# Patient Record
Sex: Female | Born: 1995 | Race: White | Hispanic: No | Marital: Single | Smoking: Never smoker
Health system: Southern US, Community
[De-identification: ages and names within clinical notes are randomized; demographics above are authoritative.]

## PROBLEM LIST (undated history)

## (undated) DIAGNOSIS — F419 Anxiety disorder, unspecified: Secondary | ICD-10-CM

## (undated) HISTORY — PX: COSMETIC SURGERY: SHX468

## (undated) HISTORY — DX: Anxiety disorder, unspecified: F41.9

---

## 2015-02-18 ENCOUNTER — Ambulatory Visit: Payer: Self-pay | Admitting: Family Medicine

## 2015-03-11 ENCOUNTER — Ambulatory Visit: Payer: Self-pay | Admitting: Family Medicine

## 2015-08-11 ENCOUNTER — Ambulatory Visit (INDEPENDENT_AMBULATORY_CARE_PROVIDER_SITE_OTHER): Payer: Worker's Compensation | Admitting: Family Medicine

## 2015-08-11 ENCOUNTER — Encounter: Payer: Self-pay | Admitting: Family Medicine

## 2015-08-11 VITALS — BP 124/77 | HR 87 | Ht 66.0 in | Wt 150.0 lb

## 2015-08-11 DIAGNOSIS — S060X2A Concussion with loss of consciousness of 31 minutes to 59 minutes, initial encounter: Secondary | ICD-10-CM

## 2015-08-11 MED ORDER — AMITRIPTYLINE HCL 25 MG PO TABS: 25.0000 mg | ORAL_TABLET | Freq: Every day | ORAL | Status: DC

## 2015-08-11 NOTE — Patient Instructions (Addendum)
You have postconcussion syndrome. When you get copies of your records please let us copy them as well (especially the CT scan). Take amitriptyline  at bedtime - we have room to go up on this if needed. Start CBT as well. Let me know if you're having problems with classes so we can make other arrangements. Follow up with me in 4 weeks.

## 2015-08-16 ENCOUNTER — Telehealth: Payer: Self-pay | Admitting: Family Medicine

## 2015-08-16 NOTE — Telephone Encounter (Signed)
Spoke to patient and told her the information provided by the physician. She stated that she would take 2 at bedtime daily and would call us back in several days to let us know how she is doing.

## 2015-08-16 NOTE — Telephone Encounter (Signed)
If after a week she's tolerating it but it's not seeming to help enough we can double it (she can take 2 at bedtime daily and we could send in a script for the higher dose too).  Just let me know if she wants to do this.

## 2015-08-17 DIAGNOSIS — F0781 Postconcussional syndrome: Secondary | ICD-10-CM | POA: Insufficient documentation

## 2015-08-17 NOTE — Assessment & Plan Note (Signed)
having major issues with headaches, difficulty with concentration and memory primarily.  Discussed options and will go ahead with rx amitriptyline and CBT as next steps.  Discussed light cardio at most which may help with recovery if symptoms do not worsen with this.  F/u in 4 weeks.  Given note for school - needs additional time to complete coursework, may need frequent breaks.
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
## 2015-08-19 NOTE — Telephone Encounter (Signed)
Case worker took prescription for CBT and will arrange.

## 2015-08-25 ENCOUNTER — Ambulatory Visit: Payer: Worker's Compensation | Admitting: Family Medicine

## 2015-08-31 ENCOUNTER — Ambulatory Visit: Payer: Worker's Compensation | Admitting: Family Medicine

## 2015-09-01 ENCOUNTER — Ambulatory Visit: Payer: Worker's Compensation | Admitting: Family Medicine

## 2015-09-06 ENCOUNTER — Ambulatory Visit: Payer: Self-pay | Admitting: Family Medicine

## 2015-09-07 ENCOUNTER — Encounter: Payer: Self-pay | Admitting: Family Medicine

## 2015-09-07 ENCOUNTER — Ambulatory Visit: Payer: Worker's Compensation | Attending: Psychology | Admitting: Psychology

## 2015-09-07 ENCOUNTER — Ambulatory Visit (INDEPENDENT_AMBULATORY_CARE_PROVIDER_SITE_OTHER): Payer: Worker's Compensation | Admitting: Family Medicine

## 2015-09-07 ENCOUNTER — Ambulatory Visit: Payer: Self-pay | Admitting: Family Medicine

## 2015-09-07 VITALS — BP 127/80 | HR 80 | Ht 66.0 in | Wt 150.0 lb

## 2015-09-07 DIAGNOSIS — F0781 Postconcussional syndrome: Secondary | ICD-10-CM
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
## 2015-09-07 NOTE — Patient Instructions (Signed)
You have postconcussion syndrome. Keep your appointment today with the neuropsychologist. Follow up with me in 1 month for reevaluation. No sports.  Ok for light weight lifting as we discussed, cardio.  Avoid anything that causes symptoms to occur.

## 2015-09-09 NOTE — Assessment & Plan Note (Signed)
first visit with neuropsychologist is today - encouraged to follow through with this even though she's clinically improving.  Will hold off on any medications at this point - no benefit with amitriptyline and may have caused her to have severe nausea.  F/u in 1 month.  No sports in meantime (cardio, running ok).
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
## 2015-09-13 ENCOUNTER — Telehealth: Payer: Self-pay | Admitting: Family Medicine

## 2015-09-13 NOTE — Telephone Encounter (Signed)
This is part of her postconcussion syndrome for which we are treating her along with neuropsych.  Referral to neurology is not indicated at this time.

## 2015-09-16 ENCOUNTER — Telehealth: Payer: Self-pay | Admitting: *Deleted

## 2015-09-16 ENCOUNTER — Ambulatory Visit: Payer: Worker's Compensation | Attending: Psychology | Admitting: Psychology

## 2015-09-16 DIAGNOSIS — F0781 Postconcussional syndrome: Secondary | ICD-10-CM | POA: Insufficient documentation

## 2015-09-16 MED ORDER — PROPRANOLOL HCL 60 MG PO TABS: 30.0000 mg | ORAL_TABLET | Freq: Three times a day (TID) | ORAL | Status: DC

## 2015-09-16 NOTE — Telephone Encounter (Signed)
Patient called and wants something for headaches.

## 2015-09-16 NOTE — Telephone Encounter (Signed)
Spoke to patient and told her prescription sent to her pharmacy.

## 2015-09-16 NOTE — Progress Notes (Signed)
[facility]  [address]   Telephone [phone] [address] Fax [phone] [place], [place] [address]   Psychology Progress Note   Name:  Teresa Dean Date of Birth:  June 30, 1996 MRN:  [id number]  Date:09/16/2015 (63m) session #1 Received message that her worker's compensation carrier has approved treatment as requested.  Reviewed her symptoms, actvity level and coping. She reported moderate to severe headaches that have occured mainly in the late afternoon.The only trigger she could identify was mental exertion. She described her thinking as often "foggy", mostly while having a headache but sometimes when not having a headache. She reported being easily distracted, both internally and externally. Sleeping described as disrupted by two or three brief mid-night awakenings but otherwise decent. Reports she is eating well. Denied use of alcohol or drugs. No report of difficulties with mood or mood control though she did report experiencing intruisive thoughts about past traumatic experiences. No report of suicidal ideation or psychotic symptoms. She expressed reluctance to quit her part-time jobs as was previously suggested..  Diagnostic Impression Postconcussional syndrome [F 07.81]  Recommended that she: Strongly consider use of headache medication that was suggested by her physician.  Try to schedule a rest period or short nap every day after class around lunchtime before her headache starts. For headache reduction, try putting a cold washcloth on her forehead and wearing sunglasses inside and outside to reduce light stimulation. Continue to look for possible triggers for headache.  Talk to her psychotherapist about ways to control any intrusive thoughts or feelings related to her past trauma.  Return in two weeks.   Gladstone Pih, Ph.D Licensed Psychologist

## 2015-09-16 NOTE — Telephone Encounter (Signed)
Sent propranolol to her pharmacy - start with 1/2 tablet three times a day.  If she tolerates this in the future we can increase to a full tablet three times a day.  Thanks!

## 2015-09-30 ENCOUNTER — Ambulatory Visit: Payer: Worker's Compensation | Attending: Psychology | Admitting: Psychology

## 2015-09-30 DIAGNOSIS — F0781 Postconcussional syndrome: Secondary | ICD-10-CM | POA: Insufficient documentation
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
## 2015-10-05 ENCOUNTER — Telehealth: Payer: Self-pay | Admitting: Family Medicine

## 2015-10-05 MED ORDER — METHYLPHENIDATE HCL 10 MG PO TABS: 10.0000 mg | ORAL_TABLET | Freq: Two times a day (BID) | ORAL | Status: DC

## 2015-10-05 NOTE — Telephone Encounter (Signed)
Spoke to patient and told her that she would have to come to pick up the prescription. It would be at the front desk.

## 2015-10-05 NOTE — Telephone Encounter (Signed)
We can try this though it is controversial.  She would have to pick up the prescription though for ritalin.  I will print for her - please notify patient it's up front.  Thanks!

## 2015-10-07 ENCOUNTER — Ambulatory Visit: Payer: Self-pay | Admitting: Family Medicine

## 2015-10-08 ENCOUNTER — Telehealth: Payer: Self-pay | Admitting: *Deleted

## 2015-10-08 NOTE — Telephone Encounter (Signed)
That's extremely unlikely to be due to adderall based on the product insert of the medication.

## 2015-10-08 NOTE — Telephone Encounter (Signed)
Patient called and wanted to know if it is normal for the medicine you prescribed her would make her sleepy?

## 2015-10-08 NOTE — Telephone Encounter (Signed)
Spoke to patient and gave her the information provided by the physician. Told patient to call Monday to let us know how she is doing.

## 2015-10-11 ENCOUNTER — Telehealth: Payer: Self-pay | Admitting: *Deleted

## 2015-10-11 NOTE — Telephone Encounter (Signed)
Patient called about her medication. Reminded patient that she has a follow up on 10-14-15 and she could discuss her medication with the doctor.

## 2015-10-14 ENCOUNTER — Ambulatory Visit (INDEPENDENT_AMBULATORY_CARE_PROVIDER_SITE_OTHER): Payer: Worker's Compensation | Admitting: Family Medicine

## 2015-10-14 ENCOUNTER — Encounter: Payer: Self-pay | Admitting: Family Medicine

## 2015-10-14 ENCOUNTER — Ambulatory Visit: Payer: Worker's Compensation | Attending: Psychology | Admitting: Psychology

## 2015-10-14 VITALS — BP 129/81 | HR 71 | Ht 66.0 in | Wt 150.0 lb

## 2015-10-14 DIAGNOSIS — F0781 Postconcussional syndrome: Secondary | ICD-10-CM | POA: Diagnosis not present

## 2015-10-14 MED ORDER — AMPHETAMINE-DEXTROAMPHET ER 20 MG PO CP24: 20.0000 mg | ORAL_CAPSULE | Freq: Every day | ORAL | Status: DC

## 2015-10-14 NOTE — Progress Notes (Signed)
[facility]  [address]   Telephone [phone] [address] Fax [phone] [place], [place] [address]   Psychology Progress Note   Name:  Teresa Dean Date of Birth:  1996-05-08 MRN:  [id number]  Date:10/14/2015 (16m psychotherapy Met with JGregary Signsindividually and then with her case manager present.  She reported that her concentration was much better, she felt "calmer" and had fewer racing thoughts after using Ritalin 15 mg. though the positive effects dissipated after about two hours. She reported that after taking Ritalin she cleaned her apartment for two hours without taking a break though otherwise denied symptoms of mania. She reported "sour stomach" after taking Ritalin. She will discuss medication further with Dr. HBarbaraann Barthel She asked about the possible role her concussion might be playing with her difficulty "regulating my emotions", which she defined as being suddenly "flooded" by emotions, typically anxiety or irritability. Since this emotional pattern predated her concussion by about two years, I would conclude that her concussion was not causal but it is possible that it somewhat intensified this problem. She reported that last week she re-established care with a psychiatrist, who has switched her from Lexapro to Wellbutrin.  [name]'s status and the results of neuropsychological testing on 09/30/15 were reviewed with her case manager. Both Ms. FRivka Barbaraand JHelemrequested and were given copies of that report.  Diagnostic Impression Postconcussional syndrome [F07.81]  At this point, I do not have more to offer [name] We agreed to terminate neuropsychological services at this time. She is welcome to contact me in the future as needed. Neuropsychological re-testing in several months or in a year could be considered to track any changes from her baseline of 09/30/15. She will continue to see her psychotherapist and psychiatrist.    MJamey Ripa  Ph.D Licensed Psychologist

## 2015-10-14 NOTE — Patient Instructions (Signed)
Switch from ritalin to an extended release medication (Adderall XR). We can increase the propranolol to a full tablet (60mg ) three times a day - you can do this on your own if the headaches are frequent. Follow up with me in 1 month.
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
## 2015-10-19 NOTE — Assessment & Plan Note (Signed)
Mixed on whether she's improving or about the same - SCAT 3 scores slightly worse though headaches are less.  Released from neuropsych today.  Doing well with propranolol 30mg  3x/day - advised and increase to the full 60mg  three times a day dose if headaches do not continue to improve.  We will switch to an extended medication for her concentration - adderall XR.  F/u in 1 month for reevaluation.

## 2015-11-03 ENCOUNTER — Telehealth: Payer: Self-pay | Admitting: Family Medicine

## 2015-11-03 NOTE — Telephone Encounter (Signed)
Spoke to patient and gave her information that was provided by physician.

## 2015-11-03 NOTE — Telephone Encounter (Signed)
Adderall XR does run out by the evening - it has a length of action of 8-10 hours.  Make sure she's taking it first thing in the morning only.  If she's still having trouble sleeping it may be from general insomnia or some other factor.  I looked up the other medications she's taking to see if any of them interact with the adderall potentially making it last longer and none of them do this.

## 2015-11-16 ENCOUNTER — Encounter: Payer: Self-pay | Admitting: Family Medicine

## 2015-11-16 ENCOUNTER — Ambulatory Visit (INDEPENDENT_AMBULATORY_CARE_PROVIDER_SITE_OTHER): Payer: Worker's Compensation | Admitting: Family Medicine

## 2015-11-16 VITALS — BP 109/74 | HR 89 | Ht 66.0 in | Wt 145.0 lb

## 2015-11-16 DIAGNOSIS — F0781 Postconcussional syndrome: Secondary | ICD-10-CM | POA: Diagnosis not present

## 2015-11-16 MED ORDER — AMPHETAMINE-DEXTROAMPHET ER 30 MG PO CP24: 30.0000 mg | ORAL_CAPSULE | Freq: Every day | ORAL | Status: DC

## 2015-11-16 NOTE — Patient Instructions (Signed)
Stop the propranolol after the semester. Increase adderall xr to 30mg  daily. Follow up with me in 6 weeks for reevaluation.
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
## 2015-11-17 NOTE — Assessment & Plan Note (Signed)
Headaches are improved - will try to wean off propanolol after this semester finishes.  Concentration still an issue - will increase adderall xr from 20mg  to 30mg .  Follow up with us in 6 weeks.  Hopefully at that time we can start to decrease the adderall as other symptoms continue to improve.    Total visit time 20 minutes of which half was spent on counseling, answering questions.

## 2015-12-29 ENCOUNTER — Encounter: Payer: Self-pay | Admitting: Family Medicine

## 2015-12-29 ENCOUNTER — Ambulatory Visit (INDEPENDENT_AMBULATORY_CARE_PROVIDER_SITE_OTHER): Payer: Worker's Compensation | Admitting: Family Medicine

## 2015-12-29 VITALS — BP 119/72 | HR 90 | Ht 66.0 in | Wt 145.0 lb

## 2015-12-29 DIAGNOSIS — F0781 Postconcussional syndrome: Secondary | ICD-10-CM | POA: Diagnosis not present

## 2015-12-29 MED ORDER — AMPHETAMINE-DEXTROAMPHET ER 30 MG PO CP24: 30.0000 mg | ORAL_CAPSULE | Freq: Every day | ORAL | Status: DC

## 2015-12-29 NOTE — Patient Instructions (Signed)
You are doing very well. Use adderall still but see if you can try off of this some days and see how you do. Follow up with me in 2 months.

## 2015-12-30 NOTE — Assessment & Plan Note (Signed)
Clinically improving though big test will be when she returns to school.  Continue with adderall xr 30mg  as needed - advised to try weaning off this as well if possible.  F/u in 2 months.  Total visit time 20 minutes of which half was spent on counseling, answering questions.
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
## 2016-01-19 ENCOUNTER — Emergency Department (HOSPITAL_COMMUNITY)
Admission: EM | Admit: 2016-01-19 | Discharge: 2016-01-20 | Disposition: A | Discharge status: Home / Self Care | Payer: Worker's Compensation | Attending: Emergency Medicine | Admitting: Emergency Medicine

## 2016-01-19 ENCOUNTER — Encounter (HOSPITAL_COMMUNITY): Payer: Self-pay

## 2016-01-19 DIAGNOSIS — R300 Dysuria: Secondary | ICD-10-CM | POA: Diagnosis present

## 2016-01-19 DIAGNOSIS — Z79899 Other long term (current) drug therapy: Secondary | ICD-10-CM | POA: Diagnosis not present

## 2016-01-19 DIAGNOSIS — R338 Other retention of urine: Secondary | ICD-10-CM

## 2016-01-19 DIAGNOSIS — R339 Retention of urine, unspecified: Secondary | ICD-10-CM | POA: Diagnosis not present

## 2016-01-19 DIAGNOSIS — R34 Anuria and oliguria: Secondary | ICD-10-CM | POA: Diagnosis not present

## 2016-01-19 DIAGNOSIS — Z880 Allergy status to penicillin: Secondary | ICD-10-CM | POA: Diagnosis not present

## 2016-01-19 DIAGNOSIS — Z3202 Encounter for pregnancy test, result negative: Secondary | ICD-10-CM | POA: Diagnosis not present

## 2016-01-19 HISTORY — PX: MOUTH SURGERY: SHX715

## 2016-01-19 LAB — URINALYSIS, ROUTINE W REFLEX MICROSCOPIC
Bilirubin Urine: NEGATIVE
GLUCOSE, UA: NEGATIVE mg/dL
Hgb urine dipstick: NEGATIVE
KETONES UR: NEGATIVE mg/dL
LEUKOCYTES UA: NEGATIVE
NITRITE: NEGATIVE
PROTEIN: NEGATIVE mg/dL
Specific Gravity, Urine: 1.014 (ref 1.005–1.030)
pH: 7 (ref 5.0–8.0)

## 2016-01-19 LAB — PREGNANCY, URINE: PREG TEST UR: NEGATIVE

## 2016-01-19 NOTE — ED Notes (Signed)
Pt had an oral surgery earlier this morning, had been sedated and tubed for the surgery. Hasn't been able to void since leaving the hospital this morning except for a little before she came in tonight. They called the MD who did the surgery and he recommended she be seen.

## 2016-01-20 NOTE — ED Provider Notes (Signed)
CSN: [id number]     Arrival date & time 01/19/16  2231 History   First MD Initiated Contact with Patient 01/19/16 2346     Chief Complaint  Patient presents with  . Post-op Problem     (Consider location/radiation/quality/duration/timing/severity/associated sxs/prior Treatment) HPI Comments: Recent states she had plastic surgery early yesterday morning.  Since that time.  She has only voided once.  She feels like she needs to void.  She states she is pushing pushing pushing without any results, although she did void prior to arrival in the emergency department.  Denies any fever.  States she history came fluids, no nausea, vomiting, fever, constipation.  The history is provided by the patient.    History reviewed. No pertinent past medical history. Past Surgical History  Procedure Laterality Date  . Mouth surgery Right 01/19/16   No family history on file. Social History  Substance Use Topics  . Smoking status: Never Smoker   . Smokeless tobacco: None  . Alcohol Use: None   OB History    No data available     Review of Systems  Constitutional: Negative for fever and chills.  Gastrointestinal: Negative for abdominal pain and abdominal distention.  Genitourinary: Positive for decreased urine volume. Negative for dysuria.  Neurological: Negative for headaches.  All other systems reviewed and are negative.     Allergies  Amoxicillin  Home Medications   Prior to Admission medications   Medication Sig Start Date End Date Taking? Authorizing Provider  amphetamine-dextroamphetamine (ADDERALL XR) 30 MG 24 hr capsule Take 1 capsule (30 mg total) by mouth daily. 12/29/15  Yes Lenda Kelp, MD  buPROPion (WELLBUTRIN XL) 150 MG 24 hr tablet TK 1 T PO Q MORNING FOR MOOD 10/05/15  Yes Historical Provider, MD   BP 113/75 mmHg  Pulse 61  Temp(Src) 98.2 F (36.8 C) (Oral)  Resp 16  SpO2 100%  LMP 01/02/2016 Physical Exam  Constitutional: She is oriented to person, place, and  time. She appears well-developed and well-nourished.  HENT:  Head: Normocephalic.  Eyes: Pupils are equal, round, and reactive to light.  Neck: Normal range of motion.  Cardiovascular: Normal rate and regular rhythm.   Pulmonary/Chest: Effort normal and breath sounds normal.  Abdominal: Soft. She exhibits no distension. There is no tenderness.  Musculoskeletal: Normal range of motion.  Neurological: She is alert and oriented to person, place, and time.  Skin: Skin is warm.  Nursing note and vitals reviewed.   ED Course  Procedures (including critical care time) Labs Review Labs Reviewed  URINALYSIS, ROUTINE W REFLEX MICROSCOPIC (NOT AT [facility]) - Abnormal; Notable for the following:    APPearance CLOUDY (*)    All other components within normal limits  PREGNANCY, URINE    Imaging Review No results found. I have personally reviewed and evaluated these images and lab results as part of my medical decision-making.   EKG Interpretation None     urine sample was obtained.  No sign of urinary tract infection.  Bladder scan shows that she is 83 cc of urine in her bladder.  She will be given by mouth fluids and reassessed Patient was able to urinate spontaneously and feels MDM   Final diagnoses:  Dysuria  Acute urinary retention         Earley Favor, NP 01/20/16 0236  Tomasita Crumble, MD 01/20/16 [phone]

## 2016-01-20 NOTE — Discharge Instructions (Signed)
Acute Urinary Retention, Female Urinary retention means you are unable to pee completely or at all (empty your bladder). HOME CARE  Drink enough fluids to keep your pee (urine) clear or pale yellow.  If you are sent home with a tube that drains the bladder (catheter), there will be a drainage bag attached to it. There are two types of bags. One is big that you can wear at night without having to empty it. One is smaller and needs to be emptied more often.  Keep the drainage bag emptied.  Keep the drainage bag lower than the tube.  Only take medicine as told by your doctor. GET HELP IF:  You have a low-grade fever.  You have spasms or you are leaking pee when you have spasms. GET HELP RIGHT AWAY IF:   You have chills or a fever.  Your catheter stops draining pee.  Your catheter falls out.  You have increased bleeding that does not stop after you have rested and increased the amount of fluids you had been drinking. MAKE SURE YOU:   Understand these instructions.  Will watch your condition.  Will get help right away if you are not doing well or get worse.   This information is not intended to replace advice given to you by your health care provider. Make sure you discuss any questions you have with your health care provider.   Document Released: 05/22/2008 Document Revised: 04/20/2015 Document Reviewed: 05/15/2013 Elsevier Interactive Patient Education 2016 [employer].   Drink plenty of fluids today, your urine was tested, showing no signs of infection after you drink a significant amount of fluid.  You able to urinate spontaneously

## 2016-01-20 NOTE — ED Notes (Signed)
Family member continued to roam the hall and stopped a member of registration questioning him as to why it was taking so long for the paperwork and where the Nurse was. Family member was informed by the registration staff that has nothing to do with the discharge paperwork and that she would have to talk to the nursing staff. Family member then approached the secretary, even though 3 RNs were sitting at the desk, questioning her as to why it was taking so long for the paperwork and as to why Shanda Bumps had not came in with the paperwork yet. The family member was informed again that the pt was not up for discharge yet. As the secretary was completing the conversation the family member she then informed them that she just flipped over to discharge and that the paperwork would begin to print. This RN went and got the discharge instructions from pediatrics to help out Shanda Bumps since she was stuck in another room.

## 2016-01-20 NOTE — ED Notes (Signed)
Pt given three cups of water to drink

## 2016-01-20 NOTE — ED Notes (Addendum)
Pt family member out of room walking the hallway attempting to look for PA. Family member as well as pt informed and reminded on several occasions that this particular pt was not the PA only patient and that she was not on the unit at the time. The family member is visibly upset. The family member ignored the Clinical research associate.

## 2016-02-24 ENCOUNTER — Ambulatory Visit (INDEPENDENT_AMBULATORY_CARE_PROVIDER_SITE_OTHER): Payer: Worker's Compensation | Admitting: Family Medicine

## 2016-02-24 ENCOUNTER — Encounter: Payer: Self-pay | Admitting: Family Medicine

## 2016-02-24 VITALS — BP 138/79 | HR 89 | Ht 66.0 in | Wt 140.0 lb

## 2016-02-24 DIAGNOSIS — F0781 Postconcussional syndrome: Secondary | ICD-10-CM

## 2016-02-24 MED ORDER — AMPHETAMINE-DEXTROAMPHET ER 30 MG PO CP24: 30.0000 mg | ORAL_CAPSULE | Freq: Every day | ORAL | Status: DC

## 2016-02-24 NOTE — Patient Instructions (Signed)
Take adderall as needed through the end of this school year then stop it after finals. Follow up with me in 3 months for likely final visit.

## 2016-02-28 NOTE — Assessment & Plan Note (Signed)
Clinically improving.  At this point plan to see her back in 3 months for likely final visit.  Will take adderall only as needed - last prescription given today to last through end of school year then stop completely.  Plan to assign MMI/PPI at that visit.  Total visit time 20 minutes of which half was spent on counseling, answering questions.
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
## 2016-03-02 DIAGNOSIS — T148XXA Other injury of unspecified body region, initial encounter: Secondary | ICD-10-CM | POA: Insufficient documentation

## 2016-05-16 ENCOUNTER — Encounter: Payer: Self-pay | Admitting: Family Medicine

## 2016-05-16 ENCOUNTER — Ambulatory Visit (INDEPENDENT_AMBULATORY_CARE_PROVIDER_SITE_OTHER): Payer: Worker's Compensation | Admitting: Family Medicine

## 2016-05-16 VITALS — BP 106/74 | HR 84 | Ht 66.0 in | Wt 139.0 lb

## 2016-05-16 DIAGNOSIS — F0781 Postconcussional syndrome: Secondary | ICD-10-CM

## 2016-05-16 NOTE — Assessment & Plan Note (Signed)
Clinically back to baseline now.  Will scan SCAT3 into chart.  Call us with any questions or concerns.  Otherwise will discharge from care.  Total visit time 15 minutes of which half was spent on counseling, answering questions.
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
## 2016-05-16 NOTE — Patient Instructions (Signed)
You are cleared at this point without restrictions. Call if you have any problems.

## 2016-05-17 ENCOUNTER — Telehealth: Payer: Self-pay | Admitting: Family Medicine

## 2016-05-17 NOTE — Telephone Encounter (Signed)
Yes she is at MMI.  Her partial impairment rating is 0.

## 2016-05-23 NOTE — Telephone Encounter (Signed)
Unable to contact WC. Will try again.

## 2016-05-26 ENCOUNTER — Ambulatory Visit: Payer: Self-pay | Admitting: Family Medicine

## 2016-06-01 ENCOUNTER — Ambulatory Visit: Payer: Self-pay | Admitting: Family Medicine

## 2017-01-01 ENCOUNTER — Ambulatory Visit (INDEPENDENT_AMBULATORY_CARE_PROVIDER_SITE_OTHER): Payer: BC Managed Care – PPO

## 2017-01-01 ENCOUNTER — Ambulatory Visit (INDEPENDENT_AMBULATORY_CARE_PROVIDER_SITE_OTHER): Payer: BC Managed Care – PPO | Admitting: Physician Assistant

## 2017-01-01 VITALS — BP 142/82 | HR 84 | Temp 98.4°F | Resp 18 | Ht 66.0 in

## 2017-01-01 DIAGNOSIS — R1030 Lower abdominal pain, unspecified: Secondary | ICD-10-CM | POA: Diagnosis not present

## 2017-01-01 DIAGNOSIS — D649 Anemia, unspecified: Secondary | ICD-10-CM

## 2017-01-01 DIAGNOSIS — E559 Vitamin D deficiency, unspecified: Secondary | ICD-10-CM

## 2017-01-01 DIAGNOSIS — F509 Eating disorder, unspecified: Secondary | ICD-10-CM | POA: Insufficient documentation

## 2017-01-01 LAB — POCT URINALYSIS DIP (MANUAL ENTRY)
Bilirubin, UA: NEGATIVE
GLUCOSE UA: NEGATIVE
Ketones, POC UA: NEGATIVE
Leukocytes, UA: NEGATIVE
NITRITE UA: NEGATIVE
PROTEIN UA: NEGATIVE
RBC UA: NEGATIVE
Spec Grav, UA: 1.015
UROBILINOGEN UA: 0.2
pH, UA: 6

## 2017-01-01 LAB — POC MICROSCOPIC URINALYSIS (UMFC): Mucus: ABSENT

## 2017-01-01 LAB — POCT URINE PREGNANCY: Preg Test, Ur: NEGATIVE

## 2017-01-01 MED ORDER — HYOSCYAMINE SULFATE 0.125 MG SL SUBL: 0.1250 mg | SUBLINGUAL_TABLET | SUBLINGUAL | 0 refills | Status: DC | PRN
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
## 2017-01-01 NOTE — Patient Instructions (Addendum)
The urine specimen is normal. There is no evidence of infection or other urinary process. The xrays are normal, except for a moderate stool burden. The pain may be due to cramping of the colon as stool moves through, making room for the contents of the most recent meal.  We are still waiting on the blood tests.  Try the hyoscyamine, about 30 minutes before meals. Let's see if we can reduce the pain after meals.  After your surgery, please schedule a wellness visit.    IF you received an x-ray today, you will receive an invoice from [facility]. Please contact [facility] at [phone] with questions or concerns regarding your invoice.   IF you received labwork today, you will receive an invoice from [facility]. Please contact [facility] at [phone] with questions or concerns regarding your invoice.   Our billing staff will not be able to assist you with questions regarding bills from these companies.  You will be contacted with the lab results as soon as they are available. The fastest way to get your results is to activate your My Chart account. Instructions are located on the last page of this paperwork. If you have not heard from us regarding the results in 2 weeks, please contact this office.

## 2017-01-02 ENCOUNTER — Encounter: Payer: Self-pay | Admitting: Physician Assistant

## 2017-01-02 DIAGNOSIS — D508 Other iron deficiency anemias: Secondary | ICD-10-CM

## 2017-01-02 LAB — COMPREHENSIVE METABOLIC PANEL
ALBUMIN: 4.5 g/dL (ref 3.5–5.5)
ALK PHOS: 65 IU/L (ref 39–117)
ALT: 13 IU/L (ref 0–32)
AST: 16 IU/L (ref 0–40)
Albumin/Globulin Ratio: 1.7 (ref 1.2–2.2)
BUN / CREAT RATIO: 19 (ref 9–23)
BUN: 16 mg/dL (ref 6–20)
Bilirubin Total: <0.2 mg/dL (ref 0.0–1.2)
CO2: 25 mmol/L (ref 18–29)
CREATININE: 0.86 mg/dL (ref 0.57–1.00)
Calcium: 9.6 mg/dL (ref 8.7–10.2)
Chloride: 101 mmol/L (ref 96–106)
GFR calc Af Amer: 112 mL/min/{1.73_m2} (ref >59)
GFR calc non Af Amer: 97 mL/min/{1.73_m2} (ref >59)
GLUCOSE: 96 mg/dL (ref 65–99)
Globulin, Total: 2.7 g/dL (ref 1.5–4.5)
Potassium: 4.1 mmol/L (ref 3.5–5.2)
Sodium: 142 mmol/L (ref 134–144)
Total Protein: 7.2 g/dL (ref 6.0–8.5)

## 2017-01-02 LAB — CBC WITH DIFFERENTIAL/PLATELET
BASOS: 1 %
Basophils Absolute: 0 10*3/uL (ref 0.0–0.2)
EOS (ABSOLUTE): 2.2 10*3/uL — ABNORMAL HIGH (ref 0.0–0.4)
EOS: 28 %
HEMATOCRIT: 45.1 % (ref 34.0–46.6)
HEMOGLOBIN: 14.9 g/dL (ref 11.1–15.9)
IMMATURE GRANULOCYTES: 0 %
Immature Grans (Abs): 0 10*3/uL (ref 0.0–0.1)
LYMPHS: 21 %
Lymphocytes Absolute: 1.7 10*3/uL (ref 0.7–3.1)
MCH: 30.7 pg (ref 26.6–33.0)
MCHC: 33 g/dL (ref 31.5–35.7)
MCV: 93 fL (ref 79–97)
Monocytes Absolute: 0.4 10*3/uL (ref 0.1–0.9)
Monocytes: 5 %
NEUTROS PCT: 45 %
Neutrophils Absolute: 3.7 10*3/uL (ref 1.4–7.0)
Platelets: 227 10*3/uL (ref 150–379)
RBC: 4.85 x10E6/uL (ref 3.77–5.28)
RDW: 13.6 % (ref 12.3–15.4)
WBC: 8.1 10*3/uL (ref 3.4–10.8)

## 2017-01-02 LAB — VITAMIN D 25 HYDROXY (VIT D DEFICIENCY, FRACTURES): Vit D, 25-Hydroxy: 36.5 ng/mL (ref 30.0–100.0)

## 2017-01-05 ENCOUNTER — Encounter: Payer: Self-pay | Admitting: Physician Assistant

## 2017-01-05 ENCOUNTER — Telehealth: Payer: Self-pay

## 2017-01-05 DIAGNOSIS — R110 Nausea: Secondary | ICD-10-CM

## 2017-01-05 DIAGNOSIS — R1030 Lower abdominal pain, unspecified: Secondary | ICD-10-CM

## 2017-01-05 MED ORDER — PROMETHAZINE HCL 25 MG PO TABS: 12.5000 mg | ORAL_TABLET | Freq: Three times a day (TID) | ORAL | 0 refills | Status: AC | PRN

## 2017-01-05 NOTE — Telephone Encounter (Addendum)
Pt is stating that meds are not helping and she is still having Stomach issues   Best number [phone]

## 2017-01-05 NOTE — Telephone Encounter (Signed)
Patient specifically requests iron studies, due to history of low iron with high-normal Hgb.  If it cannot be added on to the blood drawn at her recent visit, please advise her to come in for additional draw.  Orders Placed This Encounter  Procedures  . Fe+TIBC+Fer

## 2017-01-08 ENCOUNTER — Encounter: Payer: Self-pay | Admitting: Physician Assistant

## 2017-01-10 MED ORDER — HYOSCYAMINE SULFATE 0.125 MG SL SUBL: 0.1250 mg | SUBLINGUAL_TABLET | SUBLINGUAL | 0 refills | Status: AC | PRN

## 2017-01-10 NOTE — Addendum Note (Signed)
Addended by: Fernande BrasJEFFERY, Glorious Flicker S on: 01/10/2017 07:12 PM   Modules accepted: Orders

## 2017-01-12 ENCOUNTER — Encounter: Payer: Self-pay | Admitting: Physician Assistant

## 2017-01-12 ENCOUNTER — Ambulatory Visit (INDEPENDENT_AMBULATORY_CARE_PROVIDER_SITE_OTHER): Payer: BC Managed Care – PPO | Admitting: Physician Assistant

## 2017-01-12 ENCOUNTER — Encounter (INDEPENDENT_AMBULATORY_CARE_PROVIDER_SITE_OTHER): Payer: Self-pay

## 2017-01-12 VITALS — BP 126/76 | HR 91 | Ht 66.0 in

## 2017-01-12 DIAGNOSIS — R630 Anorexia: Secondary | ICD-10-CM | POA: Diagnosis not present

## 2017-01-12 DIAGNOSIS — R1084 Generalized abdominal pain: Secondary | ICD-10-CM | POA: Diagnosis not present

## 2017-01-12 DIAGNOSIS — K5900 Constipation, unspecified: Secondary | ICD-10-CM

## 2017-01-12 DIAGNOSIS — K589 Irritable bowel syndrome without diarrhea: Secondary | ICD-10-CM

## 2017-01-12 DIAGNOSIS — R110 Nausea: Secondary | ICD-10-CM

## 2017-01-12 MED ORDER — HYOSCYAMINE SULFATE 0.125 MG SL SUBL: 0.1250 mg | SUBLINGUAL_TABLET | SUBLINGUAL | 1 refills | Status: AC | PRN
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
## 2017-01-12 NOTE — Patient Instructions (Signed)
Please purchase the following medications over the counter and take as directed: Align probiotic once a day, can try this if your probiotic doesn't help in 2 months.   We have given you a low FODMAP diet handout.  Please purchase Miralax over the counter. Take as directed.  We have sent the following medications to your pharmacy for you to pick up at your convenience: Hycosyamine 0.25 mg every 4-6 hours as needed for pain

## 2017-01-13 ENCOUNTER — Other Ambulatory Visit: Payer: BC Managed Care – PPO

## 2017-01-13 DIAGNOSIS — D649 Anemia, unspecified: Secondary | ICD-10-CM

## 2017-01-13 NOTE — Progress Notes (Unsigned)
Lab only visit 

## 2017-01-13 NOTE — Progress Notes (Signed)
I agree with the above note, plan 

## 2017-01-14 LAB — FE+TIBC+FER
FERRITIN: 62 ng/mL (ref 15–150)
Iron Saturation: 40 % (ref 15–55)
Iron: 120 ug/dL (ref 27–159)
Total Iron Binding Capacity: 299 ug/dL (ref 250–450)
UIBC: 179 ug/dL (ref 131–425)

## 2017-03-12 ENCOUNTER — Ambulatory Visit: Payer: Self-pay | Admitting: Gastroenterology
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
## 2018-03-21 ENCOUNTER — Encounter: Payer: Self-pay | Admitting: Physician Assistant
